# Patient Record
Sex: Female | Born: 1965 | Race: Black or African American | Hispanic: No | Smoking: Never smoker
Health system: Southern US, Community
[De-identification: ages and names within clinical notes are randomized; demographics above are authoritative.]

## PROBLEM LIST (undated history)

## (undated) DIAGNOSIS — D219 Benign neoplasm of connective and other soft tissue, unspecified: Secondary | ICD-10-CM

## (undated) HISTORY — PX: ABDOMINAL HYSTERECTOMY: SHX81

## (undated) HISTORY — DX: Benign neoplasm of connective and other soft tissue, unspecified: D21.9

## (undated) HISTORY — PX: TONSILLECTOMY: SUR1361

---

## 2020-05-28 ENCOUNTER — Other Ambulatory Visit: Payer: Self-pay | Admitting: Family Medicine

## 2020-05-28 ENCOUNTER — Ambulatory Visit
Admission: RE | Admit: 2020-05-28 | Discharge: 2020-05-28 | Disposition: A | Discharge status: Home / Self Care | Payer: BLUE CROSS/BLUE SHIELD | Source: Ambulatory Visit | Attending: Family Medicine | Admitting: Family Medicine

## 2020-05-28 DIAGNOSIS — G629 Polyneuropathy, unspecified: Secondary | ICD-10-CM

## 2021-08-15 ENCOUNTER — Ambulatory Visit: Payer: Self-pay | Admitting: Family Medicine

## 2021-09-02 ENCOUNTER — Encounter: Payer: Self-pay | Admitting: Obstetrics & Gynecology

## 2021-09-02 ENCOUNTER — Ambulatory Visit (INDEPENDENT_AMBULATORY_CARE_PROVIDER_SITE_OTHER): Payer: 59 | Admitting: Obstetrics & Gynecology

## 2021-09-02 ENCOUNTER — Other Ambulatory Visit: Payer: Self-pay

## 2021-09-02 VITALS — BP 125/76 | HR 69 | Ht 63.5 in | Wt 134.0 lb

## 2021-09-02 DIAGNOSIS — Z1211 Encounter for screening for malignant neoplasm of colon: Secondary | ICD-10-CM

## 2021-09-02 DIAGNOSIS — Z01419 Encounter for gynecological examination (general) (routine) without abnormal findings: Secondary | ICD-10-CM | POA: Diagnosis not present

## 2021-09-02 DIAGNOSIS — Z1231 Encounter for screening mammogram for malignant neoplasm of breast: Secondary | ICD-10-CM | POA: Diagnosis not present

## 2021-09-02 NOTE — Progress Notes (Signed)
? ? ?GYNECOLOGY ANNUAL PREVENTATIVE CARE ENCOUNTER NOTE ? ?History:    ? Brittney Raymond is a 56 y.o. [employer] female  with history of hysterectomy for fibroids here for a routine annual gynecologic exam and to establish care.  Current complaints: none.   Denies abnormal vaginal bleeding, discharge, pelvic pain, problems with intercourse or other gynecologic concerns.  ?  ?Gynecologic History ?No LMP recorded. ?Contraception: post menopausal status ?Last Pap: several years ago. Result was normal, no history of dysplasia ?Last Mammogram: 2 years ago.  Result was normal ?Last Colonoscopy: Never had one ? ?Obstetric History ?OB History  ?Gravida Para Term Preterm AB Living  ?5         4  ?SAB IAB Ectopic Multiple Live Births  ?           ?  ?# Outcome Date GA Lbr Len/2nd Weight Sex Delivery Anes PTL Lv  ?5 Gravida           ?4 Gravida           ?3 Gravida           ?2 Gravida           ?1 Gravida           ? ? ?Past Medical History:  ?Diagnosis Date  ? Fibroid   ? ? ?Past Surgical History:  ?Procedure Laterality Date  ? ABDOMINAL HYSTERECTOMY    ? TONSILLECTOMY    ? ? ?No current outpatient medications on file prior to visit.  ? ?No current facility-administered medications on file prior to visit.  ? ? ?No Known Allergies ? ?Social History:  reports that she has never smoked. She has never used smokeless tobacco. She reports that she does not drink alcohol and does not use drugs. ? ?Family History  ?Problem Relation Age of Onset  ? Cancer Sister   ? ? ?The following portions of the patient's history were reviewed and updated as appropriate: allergies, current medications, past family history, past medical history, past social history, past surgical history and problem list. ? ?Review of Systems ?Pertinent items noted in HPI and remainder of comprehensive ROS otherwise negative. ? ?Physical Exam:  ?BP 125/76   Pulse 69   Ht 5' 3.5" (1.613 m)   Wt 134 lb (60.8 kg)   BMI 23.36 kg/m?  ?CONSTITUTIONAL:  Well-developed, well-nourished female in no acute distress.  ?HENT:  Normocephalic, atraumatic, External right and left ear normal.  ?EYES: Conjunctivae and EOM are normal. Pupils are equal, round, and reactive to light. No scleral icterus.  ?NECK: Normal range of motion, supple, no masses.  Normal thyroid.  ?SKIN: Skin is warm and dry. No rash noted. Not diaphoretic. No erythema. No pallor. ?MUSCULOSKELETAL: Normal range of motion. No tenderness.  No cyanosis, clubbing, or edema. ?NEUROLOGIC: Alert and oriented to person, place, and time. Normal reflexes, muscle tone coordination.  ?PSYCHIATRIC: Normal mood and affect. Normal behavior. Normal judgment and thought content. ?CARDIOVASCULAR: Normal heart rate noted, regular rhythm ?RESPIRATORY: Clear to auscultation bilaterally. Effort and breath sounds normal, no problems with respiration noted. ?BREASTS: Symmetric in size. No masses, tenderness, skin changes, nipple drainage, or lymphadenopathy bilaterally. Performed in the presence of a chaperone. ?ABDOMEN: Soft, no distention noted.  No tenderness, rebound or guarding.  ?PELVIC: Normal appearing external genitalia and urethral meatus; normal appearing vaginal mucosa and well-healed cervical cuff.  No abnormal vaginal discharge noted.  No other palpable masses, no adnexal region tenderness.  Performed in the presence of  a chaperone. ?  ?Assessment and Plan:  ?  1. Breast cancer screening by mammogram ?Mammogram scheduled ?- MM 3D SCREEN BREAST BILATERAL; Future ? ?2. Colon cancer screening ?Recommended screening colonoscopy as gold standard test, she declined this.  Discussed Cologuard and emphasized that this was not gold standard, any positive results will need to be followed by diagnostic colonoscopy. She wants to do this, this was ordered. ?- Cologuard ? ?3. Well woman exam with routine gynecological exam ?She is s/p hysterectomy for benign reasons, absent cervix, no need for pap smears. ?Normal examination  findings. ?Routine preventative health maintenance measures emphasized. ?Please refer to After Visit Summary for other counseling recommendations.  ?   ? ?Verita Schneiders, MD, FACOG ?Obstetrician Social research officer, government, Faculty Practice ?[facility] ? ?

## 2021-09-09 ENCOUNTER — Encounter (HOSPITAL_BASED_OUTPATIENT_CLINIC_OR_DEPARTMENT_OTHER): Payer: Self-pay | Admitting: Radiology

## 2021-09-09 ENCOUNTER — Other Ambulatory Visit: Payer: Self-pay

## 2021-09-09 ENCOUNTER — Ambulatory Visit (HOSPITAL_BASED_OUTPATIENT_CLINIC_OR_DEPARTMENT_OTHER)
Admission: RE | Admit: 2021-09-09 | Discharge: 2021-09-09 | Disposition: A | Discharge status: Home / Self Care | Payer: 59 | Source: Ambulatory Visit | Attending: Obstetrics & Gynecology | Admitting: Obstetrics & Gynecology

## 2021-09-09 DIAGNOSIS — Z1231 Encounter for screening mammogram for malignant neoplasm of breast: Secondary | ICD-10-CM | POA: Insufficient documentation

## 2021-09-29 ENCOUNTER — Ambulatory Visit: Payer: Self-pay | Admitting: Family Medicine

## 2021-10-14 LAB — COLOGUARD: COLOGUARD: NEGATIVE

## 2021-11-11 ENCOUNTER — Ambulatory Visit: Payer: Self-pay | Admitting: Family

## 2022-01-06 ENCOUNTER — Ambulatory Visit (INDEPENDENT_AMBULATORY_CARE_PROVIDER_SITE_OTHER): Payer: Commercial Managed Care - HMO | Admitting: Family

## 2022-01-06 ENCOUNTER — Encounter: Payer: Self-pay | Admitting: Family

## 2022-01-06 VITALS — BP 120/64 | HR 87 | Temp 98.3°F | Resp 16 | Ht 63.0 in | Wt 137.4 lb

## 2022-01-06 DIAGNOSIS — Z0000 Encounter for general adult medical examination without abnormal findings: Secondary | ICD-10-CM

## 2022-01-06 DIAGNOSIS — Z789 Other specified health status: Secondary | ICD-10-CM

## 2022-01-06 DIAGNOSIS — E559 Vitamin D deficiency, unspecified: Secondary | ICD-10-CM | POA: Diagnosis not present

## 2022-01-06 DIAGNOSIS — Z2821 Immunization not carried out because of patient refusal: Secondary | ICD-10-CM

## 2022-01-06 DIAGNOSIS — D509 Iron deficiency anemia, unspecified: Secondary | ICD-10-CM | POA: Diagnosis not present

## 2022-01-06 DIAGNOSIS — Z13220 Encounter for screening for lipoid disorders: Secondary | ICD-10-CM

## 2022-01-06 NOTE — Assessment & Plan Note (Signed)
In the past, stable per pt Will check cbc today

## 2022-01-06 NOTE — Assessment & Plan Note (Signed)
Pt refuses all vaccinations She declines tdap today as well as shingrix vaccine

## 2022-01-06 NOTE — Assessment & Plan Note (Signed)
Ordered vitamin d pending results.  Continue vitamin d

## 2022-01-06 NOTE — Assessment & Plan Note (Signed)
Patient Counseling(The following topics were reviewed):  Preventative care handout given to pt  Health maintenance and immunizations reviewed. Please refer to Health maintenance section. Pt advised on safe sex, wearing seatbelts in car, and proper nutrition labwork ordered today for annual Dental health: Discussed importance of regular tooth brushing, flossing, and dental visits.  Pt refuses colonoscopy but did agree and obtain results for cologuard  Pt up to date on mammograms  Pt will defer dexa for now until next mammogram

## 2022-01-06 NOTE — Assessment & Plan Note (Signed)
Lipid panel ordered pending results.   

## 2022-01-06 NOTE — Assessment & Plan Note (Signed)
Refuses tdap vaccination

## 2022-01-06 NOTE — Progress Notes (Signed)
New Patient Office Visit  Subjective:  Patient ID: Brittney Raymond, female    DOB: [birth year]  Age: 56 y.o. MRN: [id number]  CC:  Chief Complaint  Patient presents with   Establish Care    HPI Brittney Raymond is here to establish care as a new patient.  Prior provider was: has not seen a PCP in many years.  Sees ob-gyn Dr. Harolyn Rutherford Pt is without acute concerns.   Pap: March 2023 , negative  Mammogram, negative  Cologuard: negative 10/07/21 pt refuses colonoscopy even given high risk fmh   Declines all vaccinations,   Very adamant about all natural everything, she is plant based. No animal of anything and all whole foods.   Dentist: did a deep cleaning/descaling.    Past Medical History:  Diagnosis Date   Fibroid     Past Surgical History:  Procedure Laterality Date   ABDOMINAL HYSTERECTOMY     still with fallopian tubes and uterus in place   TONSILLECTOMY     adenoids in place she thinks    Family History  Problem Relation Age of Onset   Diabetes Mother    Heart Problems Mother        bad heart valve   Transient ischemic attack Mother    Cancer - Prostate Father    Colon cancer Sister        in 54's   Diabetes type II Brother    Diabetes Maternal Grandmother     Social History   Socioeconomic History   Marital status: Widowed    Spouse name: Not on file   Number of children: 4   Years of education: Not on file   Highest education level: Not on file  Occupational History   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  Tobacco Use   Smoking status: Never   Smokeless tobacco: Never  Vaping Use   Vaping Use: Never used  Substance and Sexual Activity   Alcohol use: Never   Drug use: Never   Sexual activity: Yes    Partners: Male    Birth control/protection: Post-menopausal  Other Topics Concern   Not on file  Social History Narrative   Three girls and a boy    Social Determinants of Radio broadcast assistant Strain: Not on file   Food Insecurity: Not on file  Transportation Needs: Not on file  Physical Activity: Not on file  Stress: Not on file  Social Connections: Not on file  Intimate Partner Violence: Not on file    Outpatient Medications Prior to Visit  Medication Sig Dispense Refill   BLACK CURRANT SEED OIL PO Take by mouth.     Cholecalciferol (VITAMIN D3) 250 MCG (10000 UT) TABS Take 10,000 mg by mouth.     LYSINE PO Take by mouth.     UNABLE TO FIND Med Name:sea moss     vitamin B-12 (CYANOCOBALAMIN) 500 MCG tablet Take 500 mcg by mouth daily.     Zinc 100 MG TABS Take by mouth.     No facility-administered medications prior to visit.    No Known Allergies  ROS Review of Systems  Review of Systems  Respiratory:  Negative for shortness of breath.   Cardiovascular:  Negative for chest pain and palpitations.  Gastrointestinal:  Negative for constipation and diarrhea.  Genitourinary:  Negative for dysuria, frequency and urgency.  Musculoskeletal:  Negative for myalgias.  Psychiatric/Behavioral:  Negative for depression and suicidal ideas.   All other systems reviewed and  are negative.    Objective:    Physical Exam Vitals reviewed.  Constitutional:      General: She is not in acute distress.    Appearance: Normal appearance. She is normal weight. She is not ill-appearing or toxic-appearing.  HENT:     Right Ear: Tympanic membrane normal.     Left Ear: Tympanic membrane normal.     Mouth/Throat:     Mouth: Mucous membranes are moist.     Pharynx: No pharyngeal swelling.     Tonsils: No tonsillar exudate.  Eyes:     Extraocular Movements: Extraocular movements intact.     Conjunctiva/sclera: Conjunctivae normal.     Pupils: Pupils are equal, round, and reactive to light.  Neck:     Thyroid: No thyroid mass.  Cardiovascular:     Rate and Rhythm: Normal rate and regular rhythm.  Pulmonary:     Effort: Pulmonary effort is normal.     Breath sounds: Normal breath sounds.  Abdominal:      General: Abdomen is flat. Bowel sounds are normal.     Palpations: Abdomen is soft.  Musculoskeletal:        General: Normal range of motion.  Lymphadenopathy:     Cervical:     Right cervical: No superficial cervical adenopathy.    Left cervical: No superficial cervical adenopathy.  Skin:    General: Skin is warm.     Capillary Refill: Capillary refill takes less than 2 seconds.  Neurological:     General: No focal deficit present.     Mental Status: She is alert and oriented to person, place, and time.  Psychiatric:        Mood and Affect: Mood normal.        Behavior: Behavior normal.        Thought Content: Thought content normal.        Judgment: Judgment normal.       BP 120/64   Pulse 87   Temp 98.3 F (36.8 C)   Resp 16   Ht $R'5\' 3"'EW$  (1.6 m)   Wt 137 lb 6 oz (62.3 kg)   SpO2 97%   BMI 24.33 kg/m  Wt Readings from Last 3 Encounters:  01/06/22 137 lb 6 oz (62.3 kg)  09/02/21 134 lb (60.8 kg)     Health Maintenance Due  Topic Date Due   HIV Screening  Never done   Hepatitis C Screening  Never done   TETANUS/TDAP  Never done   PAP SMEAR-Modifier  Never done   COLONOSCOPY (Pts 45-21yrs Insurance coverage will need to be confirmed)  Never done   Zoster Vaccines- Shingrix (1 of 2) Never done    There are no preventive care reminders to display for this patient.  No results found for: "TSH" No results found for: "WBC", "HGB", "HCT", "MCV", "PLT" No results found for: "NA", "K", "CHLORIDE", "CO2", "GLUCOSE", "BUN", "CREATININE", "BILITOT", "ALKPHOS", "AST", "ALT", "PROT", "ALBUMIN", "CALCIUM", "ANIONGAP", "EGFR", "GFR" No results found for: "CHOL" No results found for: "HDL" No results found for: "LDLCALC" No results found for: "TRIG" No results found for: "CHOLHDL" No results found for: "HGBA1C"    Assessment & Plan:   Problem List Items Addressed This Visit       Other   Iron deficiency anemia - Primary    In the past, stable per pt Will check  cbc today       Relevant Medications   vitamin B-12 (CYANOCOBALAMIN) 500 MCG tablet   Other  Relevant Orders   CBC with Differential/Platelet   Comprehensive metabolic panel   Lipid panel   Vitamin B12   Vegan diet    Will check for iron, b12 folate pending results      Relevant Orders   CBC with Differential/Platelet   Comprehensive metabolic panel   Lipid panel   Vitamin B12   Vitamin D deficiency    Ordered vitamin d pending results.  Continue vitamin d       Relevant Orders   VITAMIN D 25 Hydroxy (Vit-D Deficiency, Fractures)   Vaccination refused by patient    Pt refuses all vaccinations She declines tdap today as well as shingrix vaccine      Refuses tetanus, diphtheria, and acellular pertussis (Tdap) vaccination    Refuses tdap vaccination       Encounter for general adult medical examination without abnormal findings    Patient Counseling(The following topics were reviewed):  Preventative care handout given to pt  Health maintenance and immunizations reviewed. Please refer to Health maintenance section. Pt advised on safe sex, wearing seatbelts in car, and proper nutrition labwork ordered today for annual Dental health: Discussed importance of regular tooth brushing, flossing, and dental visits.  Pt refuses colonoscopy but did agree and obtain results for cologuard  Pt up to date on mammograms  Pt will defer dexa for now until next mammogram      Screening for lipoid disorders    Lipid panel ordered pending results.        Relevant Orders   Lipid panel    No orders of the defined types were placed in this encounter.   Follow-up: No follow-ups on file.    Eugenia Pancoast, FNP

## 2022-01-06 NOTE — Assessment & Plan Note (Signed)
Will check for iron, b12 folate pending results

## 2022-01-07 LAB — LIPID PANEL
Cholesterol: 206 mg/dL — ABNORMAL HIGH (ref <200)
HDL: 60 mg/dL (ref >=50)
Non-HDL Cholesterol (Calc): 146 mg/dL (calc) — ABNORMAL HIGH (ref <130)
Total CHOL/HDL Ratio: 3.4 (calc) (ref <5.0)
Triglycerides: 474 mg/dL — ABNORMAL HIGH (ref <150)

## 2022-01-07 LAB — COMPREHENSIVE METABOLIC PANEL
AG Ratio: 1.6 (calc) (ref 1.0–2.5)
ALT: 22 U/L (ref 6–29)
AST: 26 U/L (ref 10–35)
Albumin: 4.6 g/dL (ref 3.6–5.1)
Alkaline phosphatase (APISO): 80 U/L (ref 37–153)
BUN: 13 mg/dL (ref 7–25)
CO2: 25 mmol/L (ref 20–32)
Calcium: 10.2 mg/dL (ref 8.6–10.4)
Chloride: 103 mmol/L (ref 98–110)
Creat: 0.96 mg/dL (ref 0.50–1.03)
Globulin: 2.9 g/dL (calc) (ref 1.9–3.7)
Glucose, Bld: 98 mg/dL (ref 65–99)
Potassium: 4.7 mmol/L (ref 3.5–5.3)
Sodium: 138 mmol/L (ref 135–146)
Total Bilirubin: 0.4 mg/dL (ref 0.2–1.2)
Total Protein: 7.5 g/dL (ref 6.1–8.1)

## 2022-01-07 LAB — CBC WITH DIFFERENTIAL/PLATELET
Absolute Monocytes: 634 cells/uL (ref 200–950)
Basophils Absolute: 50 cells/uL (ref 0–200)
Basophils Relative: 0.7 %
Eosinophils Absolute: 202 cells/uL (ref 15–500)
Eosinophils Relative: 2.8 %
HCT: 34 % — ABNORMAL LOW (ref 35.0–45.0)
Hemoglobin: 11.9 g/dL (ref 11.7–15.5)
Lymphs Abs: 2095 cells/uL (ref 850–3900)
MCH: 28.8 pg (ref 27.0–33.0)
MCHC: 35 g/dL (ref 32.0–36.0)
MCV: 82.3 fL (ref 80.0–100.0)
MPV: 12.4 fL (ref 7.5–12.5)
Monocytes Relative: 8.8 %
Neutro Abs: 4219 cells/uL (ref 1500–7800)
Neutrophils Relative %: 58.6 %
Platelets: 320 10*3/uL (ref 140–400)
RBC: 4.13 10*6/uL (ref 3.80–5.10)
RDW: 13.6 % (ref 11.0–15.0)
Total Lymphocyte: 29.1 %
WBC: 7.2 10*3/uL (ref 3.8–10.8)

## 2022-01-07 LAB — VITAMIN D 25 HYDROXY (VIT D DEFICIENCY, FRACTURES): Vit D, 25-Hydroxy: 111 ng/mL — ABNORMAL HIGH (ref 30–100)

## 2022-01-07 LAB — VITAMIN B12: Vitamin B-12: >2000 pg/mL — ABNORMAL HIGH (ref 200–1100)

## 2022-01-09 NOTE — Progress Notes (Signed)
Ok I do advise that pt work on low cholesterol diet.  She can consider a calcium score, a CT scan that looks for arteries being clogged if she wants to see how high her risk of blockage is.  Cholesterol is in family, it is called Heterozygous Familial Hypercholesterolemia  Please advise her to make a three month f/u to repeat [facility]ST[facility] LABS

## 2022-01-09 NOTE — Progress Notes (Signed)
Cbc ok  Cholesterol much too high especially triglycerides, goal is less than 150,000 and she is 474. This is high risk for stroke/heart attack. Let me know how pt wants to proceed, because I know she is plant based. Ideally I would prescribe atorvastatin or rosuvastatin for this. Other options would be fish oil and or plant based supplement that is compabitle to fish oil however this may not be enough. Does pt have diet high in cheese? Heavy fats? Does family have h/o high cholesterol?  B12 also much too high can pt verify she is only taking 500 mcg daily? If so she needs to d/c. High B12 can also cause problems/side effects.   Vitamin D also too high. As we discussed in the office anything >80 can cause heart concerns.  Please decrease vitamin D dosage. I would be happy with pt d/c completely and or at least going down to 1000 mcg once daily. Not 10000   Cmp good

## 2022-01-10 ENCOUNTER — Telehealth: Payer: Self-pay | Admitting: Family

## 2022-01-10 NOTE — Telephone Encounter (Signed)
Pt called in requesting her lab results be mailed to her .Marland Kitchen Pt dose not have Mychart . Pt address is current

## 2022-01-10 NOTE — Telephone Encounter (Signed)
Sent results in mail today.

## 2022-02-16 ENCOUNTER — Telehealth: Payer: Self-pay

## 2022-02-16 NOTE — Telephone Encounter (Signed)
Did you want this pt to be a lab only in 3 months or would you prefer to see her in office. I looked at your follow up notes on the AVS and there was none. Also looked at the lab results and it said fu 3 months for fasting labs.

## 2022-02-20 NOTE — Telephone Encounter (Signed)
In office?

## 2022-02-20 NOTE — Telephone Encounter (Signed)
3 Month appt made in office to see Tabitha.

## 2022-04-14 ENCOUNTER — Ambulatory Visit: Payer: Commercial Managed Care - HMO | Admitting: Family

## 2022-05-05 ENCOUNTER — Ambulatory Visit: Payer: Commercial Managed Care - HMO | Admitting: Family

## 2022-05-12 ENCOUNTER — Encounter: Payer: Self-pay | Admitting: Family

## 2022-05-12 ENCOUNTER — Ambulatory Visit: Payer: Commercial Managed Care - HMO | Admitting: Family

## 2022-05-12 VITALS — BP 116/64 | HR 62 | Temp 98.0°F | Resp 16 | Ht 63.0 in | Wt 136.2 lb

## 2022-05-12 DIAGNOSIS — E782 Mixed hyperlipidemia: Secondary | ICD-10-CM | POA: Diagnosis not present

## 2022-05-12 DIAGNOSIS — Z2821 Immunization not carried out because of patient refusal: Secondary | ICD-10-CM | POA: Diagnosis not present

## 2022-05-12 DIAGNOSIS — E678 Other specified hyperalimentation: Secondary | ICD-10-CM

## 2022-05-12 DIAGNOSIS — E559 Vitamin D deficiency, unspecified: Secondary | ICD-10-CM

## 2022-05-12 NOTE — Assessment & Plan Note (Signed)
Pt has changed to every other day high dose vitamin D, suggestion made to decrease dose to much lower however pt politely declines. Will assess vitamin D level.

## 2022-05-12 NOTE — Assessment & Plan Note (Signed)
Declines flu vaccination

## 2022-05-12 NOTE — Progress Notes (Signed)
Established Patient Office Visit  Subjective:  Patient ID: Brittney Raymond, female    DOB: 1965/06/29  Age: 56 y.o. MRN: [id number]  CC:  Chief Complaint  Patient presents with   Hyperlipidemia    HPI Brittney Raymond is here today for follow up.   Pt is with acute concerns.  Elevated vitamin D, no longer taking everyday. Taking 10000 every other day.   Vitamin b12: excessive >1100. Changed to every other day or every 2-3 days.   Past Medical History:  Diagnosis Date   Fibroid     Past Surgical History:  Procedure Laterality Date   ABDOMINAL HYSTERECTOMY     still with fallopian tubes and uterus in place   TONSILLECTOMY     adenoids in place she thinks    Family History  Problem Relation Age of Onset   Diabetes Mother    Heart Problems Mother        bad heart valve   Transient ischemic attack Mother    Cancer - Prostate Father    Colon cancer Sister        in 93's   Diabetes type II Brother    Diabetes Maternal Grandmother     Social History   Socioeconomic History   Marital status: Widowed    Spouse name: Not on file   Number of children: 4   Years of education: Not on file   Highest education level: Not on file  Occupational History   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  Tobacco Use   Smoking status: Never   Smokeless tobacco: Never  Vaping Use   Vaping Use: Never used  Substance and Sexual Activity   Alcohol use: Never   Drug use: Never   Sexual activity: Yes    Partners: Male    Birth control/protection: Post-menopausal  Other Topics Concern   Not on file  Social History Narrative   Three girls and a boy    Social Determinants of Radio broadcast assistant Strain: Not on file  Food Insecurity: Not on file  Transportation Needs: Not on file  Physical Activity: Not on file  Stress: Not on file  Social Connections: Not on file  Intimate Partner Violence: Not on file    Outpatient Medications Prior to Visit   Medication Sig Dispense Refill   BLACK CURRANT SEED OIL PO Take by mouth.     cholecalciferol (VITAMIN D3) 25 MCG (1000 UNIT) tablet Take 10,000 Units by mouth every other day.     LYSINE PO Take by mouth.     UNABLE TO FIND Med Name:sea moss     vitamin B-12 (CYANOCOBALAMIN) 500 MCG tablet Take 500 mcg by mouth every other day.     Zinc 30 MG CAPS Take 35 mg by mouth daily.     Cholecalciferol (VITAMIN D3) 250 MCG (10000 UT) TABS Take 10,000 mg by mouth.     No facility-administered medications prior to visit.    No Known Allergies        Objective:    Physical Exam Constitutional:      Appearance: Normal appearance.  Cardiovascular:     Rate and Rhythm: Normal rate.  Pulmonary:     Effort: Pulmonary effort is normal.  Musculoskeletal:     Right lower leg: No edema.     Left lower leg: No edema.  Neurological:     General: No focal deficit present.     Mental Status: She is alert and oriented to  person, place, and time. Mental status is at baseline.  Psychiatric:        Mood and Affect: Mood normal.        Behavior: Behavior normal.        Thought Content: Thought content normal.        Judgment: Judgment normal.       BP 116/64   Pulse 62   Temp 98 F (36.7 C)   Resp 16   Ht '5\' 3"'$  (1.6 m)   Wt 136 lb 4 oz (61.8 kg)   SpO2 99%   BMI 24.14 kg/m  Wt Readings from Last 3 Encounters:  05/12/22 136 lb 4 oz (61.8 kg)  01/06/22 137 lb 6 oz (62.3 kg)  09/02/21 134 lb (60.8 kg)     Health Maintenance Due  Topic Date Due   HIV Screening  Never done   Hepatitis C Screening  Never done    There are no preventive care reminders to display for this patient.  No results found for: "TSH" Lab Results  Component Value Date   WBC 7.2 01/06/2022   HGB 11.9 01/06/2022   HCT 34.0 (L) 01/06/2022   MCV 82.3 01/06/2022   PLT 320 01/06/2022   Lab Results  Component Value Date   NA 138 01/06/2022   K 4.7 01/06/2022   CO2 25 01/06/2022   GLUCOSE 98 01/06/2022    BUN 13 01/06/2022   CREATININE 0.96 01/06/2022   BILITOT 0.4 01/06/2022   AST 26 01/06/2022   ALT 22 01/06/2022   PROT 7.5 01/06/2022   CALCIUM 10.2 01/06/2022   Lab Results  Component Value Date   CHOL 206 (H) 01/06/2022   Lab Results  Component Value Date   HDL 60 01/06/2022   Lab Results  Component Value Date   LDLCALC  01/06/2022     Comment:     . LDL cholesterol not calculated. Triglyceride levels greater than 400 mg/dL invalidate calculated LDL results. . Reference range: <100 . Desirable range <100 mg/dL for primary prevention;   <70 mg/dL for patients with CHD or diabetic patients  with > or = 2 CHD risk factors. Marland Kitchen LDL-C is now calculated using the Martin-Hopkins  calculation, which is a validated novel method providing  better accuracy than the Friedewald equation in the  estimation of LDL-C.  Cresenciano Genre et al. Annamaria Helling. 0347;425(95): 2061-2068  (http://education.[facility].com/faq/FAQ164)    Lab Results  Component Value Date   TRIG 474 (H) 01/06/2022   Lab Results  Component Value Date   CHOLHDL 3.4 01/06/2022   No results found for: "HGBA1C"    Assessment & Plan:   Problem List Items Addressed This Visit       Other   Vitamin D deficiency - Primary    Pt has changed to every other day high dose vitamin D, suggestion made to decrease dose to much lower however pt politely declines. Will assess vitamin D level.       Relevant Orders   Vitamin B12   Vaccination refused by patient    Declines flu vaccination       Elevated triglycerides with high cholesterol    Repeat fasting cholesterol  Work on low cholesterol diet exercise as tolerated       Relevant Orders   Lipid panel   Excessive vitamin B12 intake    Still taking high dose, however has changed to every other day. Did recommend pt decrease dose, however she politely declines. Will repeat vitamin B12 lab.  Relevant Orders   VITAMIN D 25 Hydroxy (Vit-D Deficiency,  Fractures)    No orders of the defined types were placed in this encounter.   Follow-up: Return in about 1 year (around 05/13/2023) for f/u cholesterol.    Eugenia Pancoast, FNP

## 2022-05-12 NOTE — Patient Instructions (Signed)
  I have created an order for lab work today during our visit.  Please schedule an appointment on your way out to return to the lab at your convenience. Please return fasting at your lab appointment (meaning you can only drink black coffee and or water prior to your appointment). I will reach out to you in regards to the labs when I receive the results.    Regards,   Eugenia Pancoast FNP-C

## 2022-05-12 NOTE — Assessment & Plan Note (Signed)
Repeat fasting cholesterol  Work on low cholesterol diet exercise as tolerated

## 2022-05-12 NOTE — Assessment & Plan Note (Signed)
Still taking high dose, however has changed to every other day. Did recommend pt decrease dose, however she politely declines. Will repeat vitamin B12 lab.

## 2022-05-25 ENCOUNTER — Other Ambulatory Visit: Payer: Commercial Managed Care - HMO

## 2023-03-04 IMAGING — MG MM DIGITAL SCREENING BILAT W/ TOMO AND CAD
8 series · 8 of 24 positions shown · non-contrast
Comparison: None.

CLINICAL DATA: Screening.

EXAM:
DIGITAL SCREENING BILATERAL MAMMOGRAM WITH TOMOSYNTHESIS AND CAD
TECHNIQUE: Bilateral screening digital craniocaudal and mediolateral oblique
mammograms were obtained. Bilateral screening digital breast
tomosynthesis was performed. The images were evaluated with
computer-aided detection.

[L MLO synth-2D]
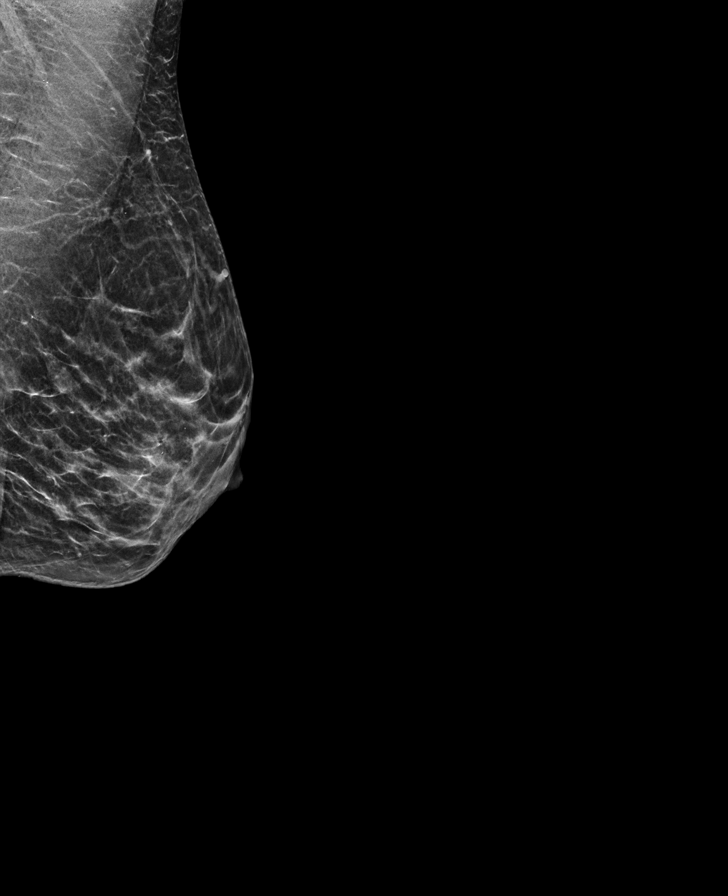

[R MLO synth-2D]
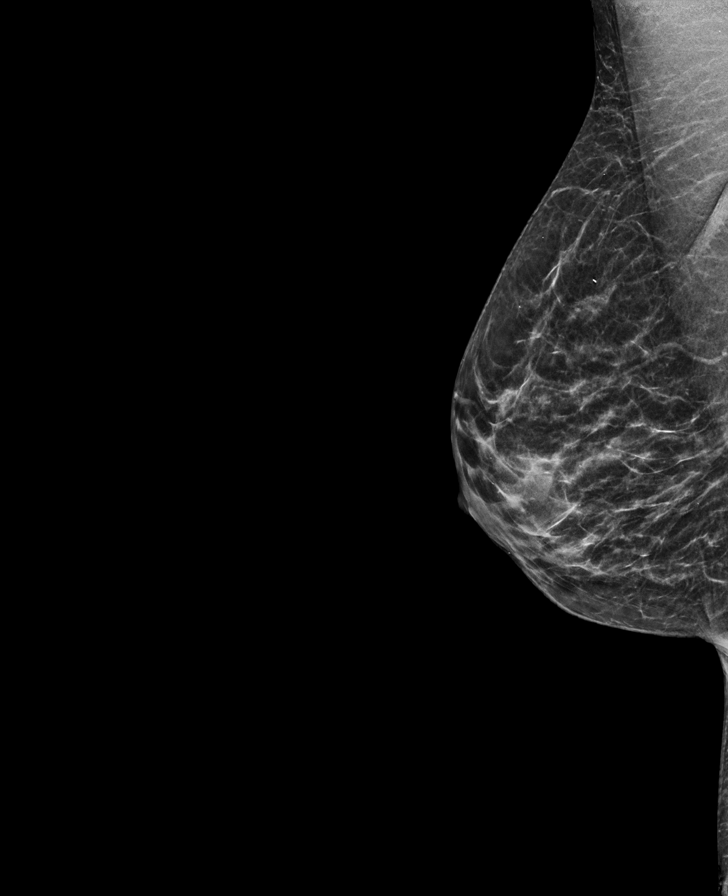

[R CC synth-2D]
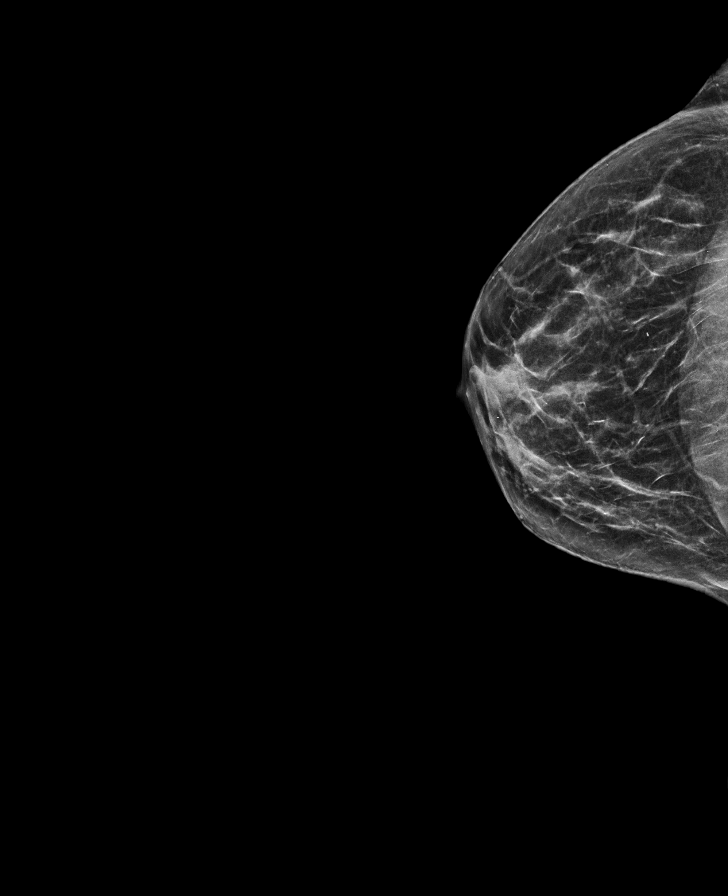

[L CC synth-2D]
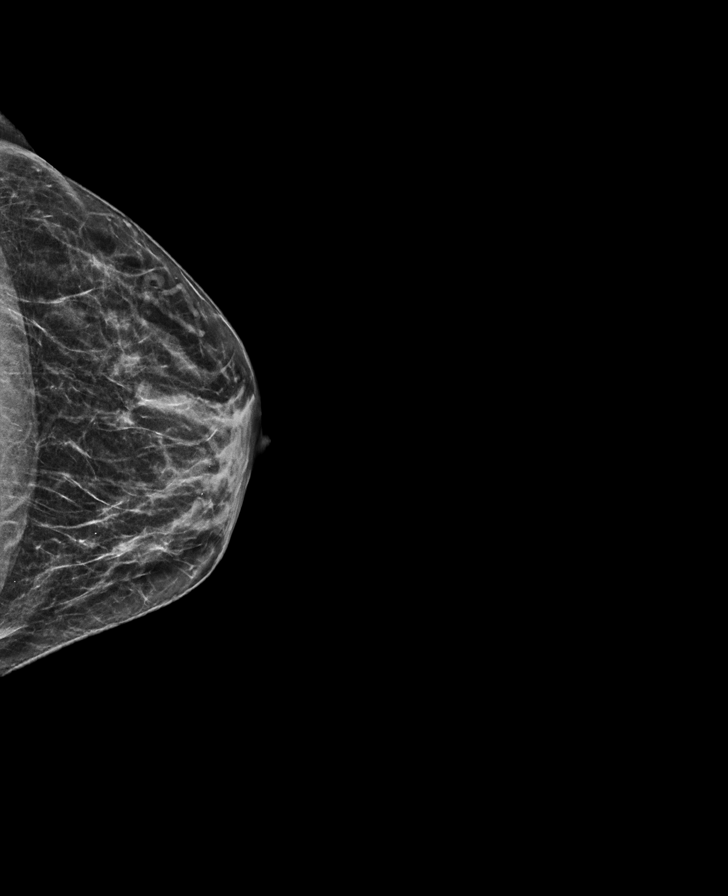

[R MLO tomo · tomo slice 24/47.0]
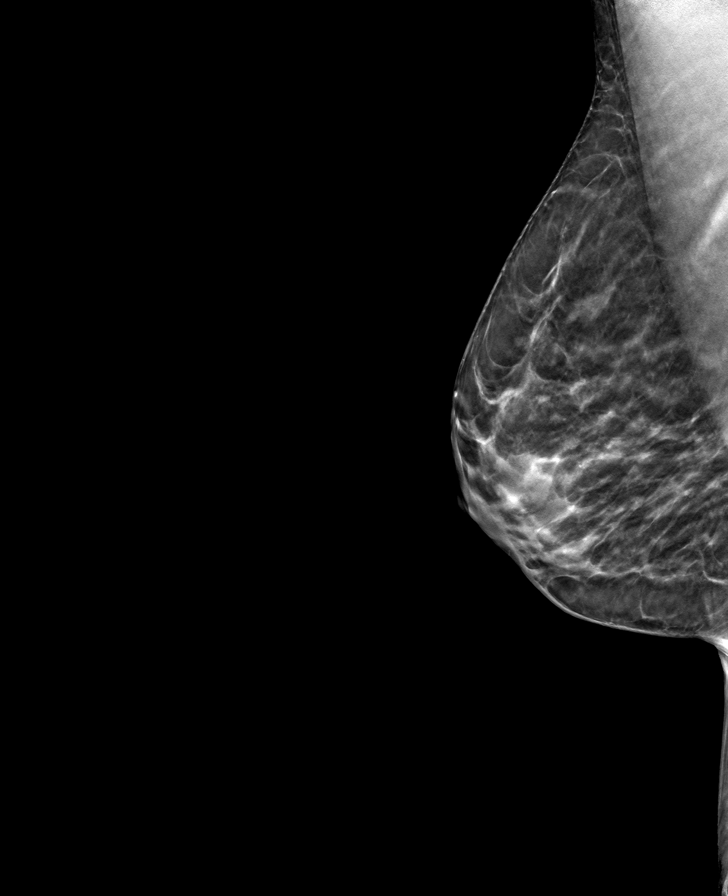

[R CC tomo · tomo slice 27/52.0]
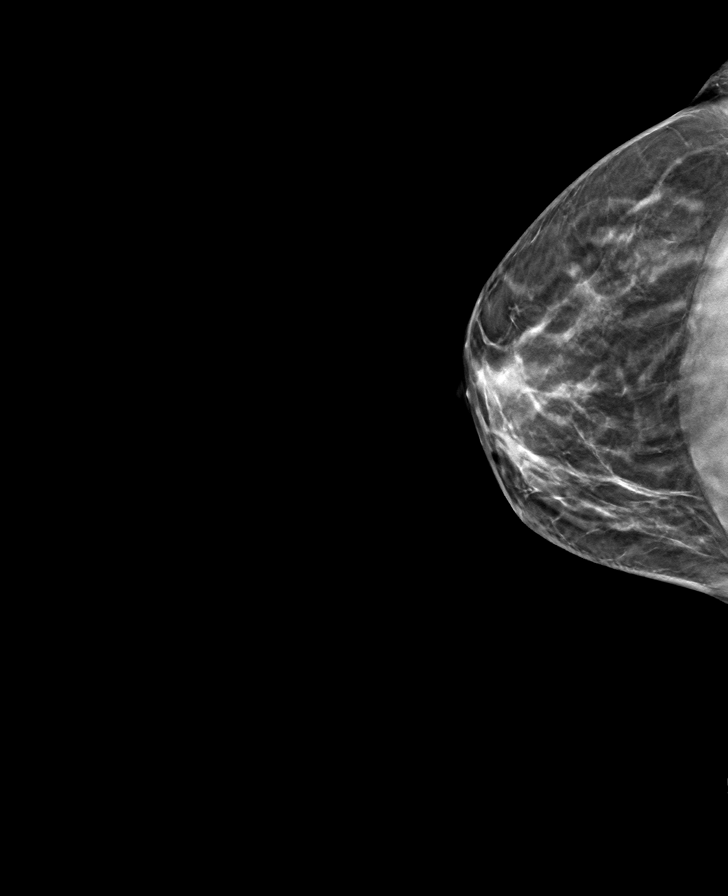

[L CC tomo · tomo slice 25/48.0]
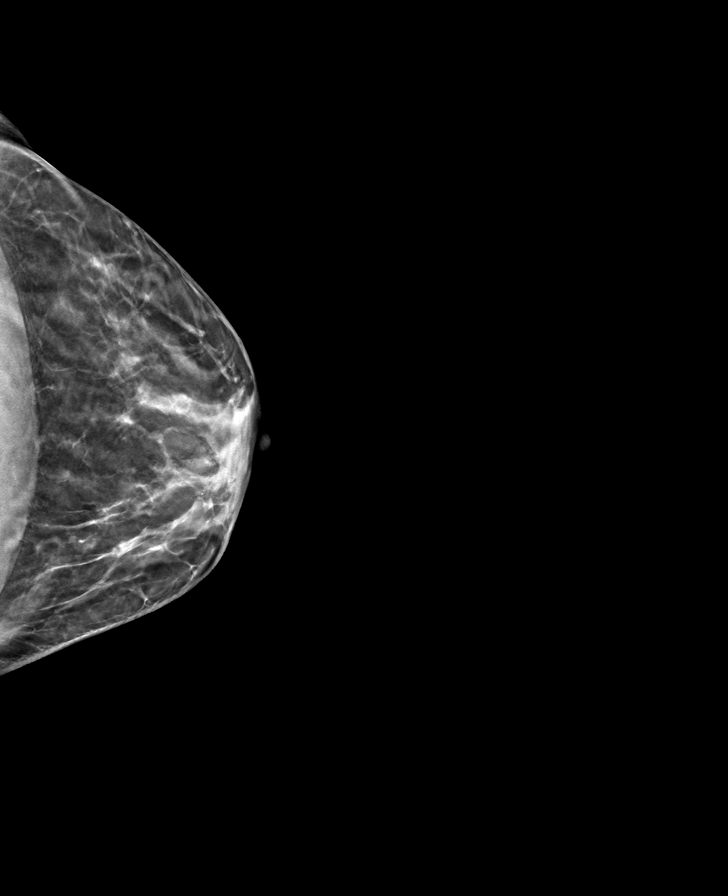

[L MLO tomo · tomo slice 23/46.0]
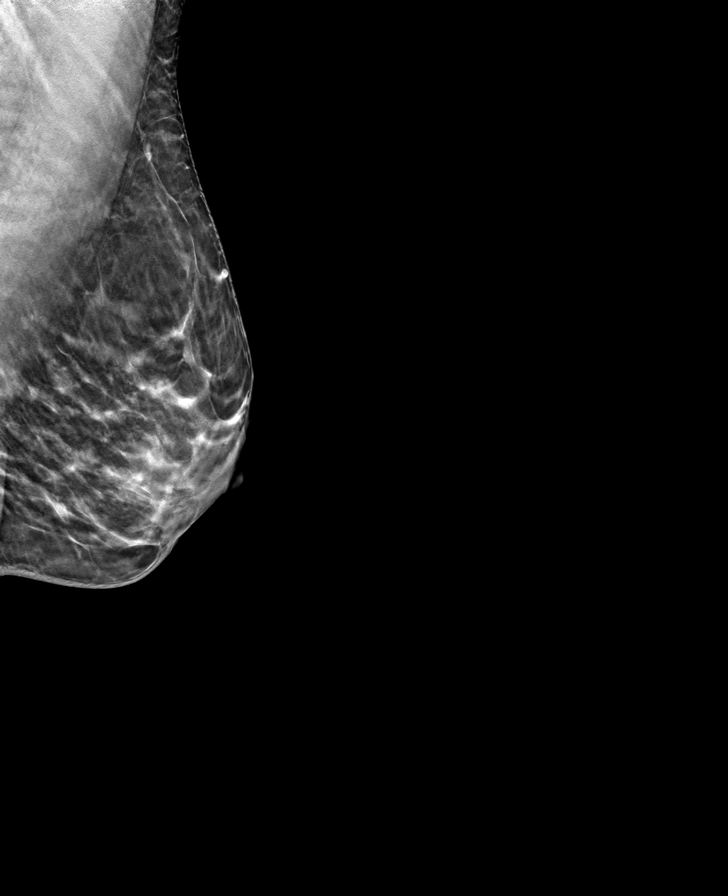

[8 of 24 positions shown; findings below may reference images not displayed]

ACR Breast Density Category b: There are scattered areas of
fibroglandular density.
FINDINGS: There are no findings suspicious for malignancy.
IMPRESSION: No mammographic evidence of malignancy. A result letter of this
screening mammogram will be mailed directly to the patient.

RECOMMENDATION:
Screening mammogram in one year. (Code:XG-X-X7B)

BI-RADS CATEGORY  1: Negative.

## 2023-05-18 ENCOUNTER — Ambulatory Visit: Payer: Commercial Managed Care - HMO | Admitting: Family
